# Patient Record
Sex: Female | Born: 1965 | Race: Black or African American | Hispanic: No | Marital: Single
Health system: Midwestern US, Community
[De-identification: ages and names within clinical notes are randomized; demographics above are authoritative.]

---

## 2013-11-30 IMAGING — CR DG HIP COMPLETE 2+V*L*
1 series · 2 of 2 positions shown · non-contrast
Comparison: None.

CLINICAL DATA: Pain, injury 3 days ago

EXAM:
LEFT HIP - COMPLETE 2+ VIEW

[Series 1: t hip ap left · 0.14mm/px · 2 of 2 slices shown]
[im 1/2]
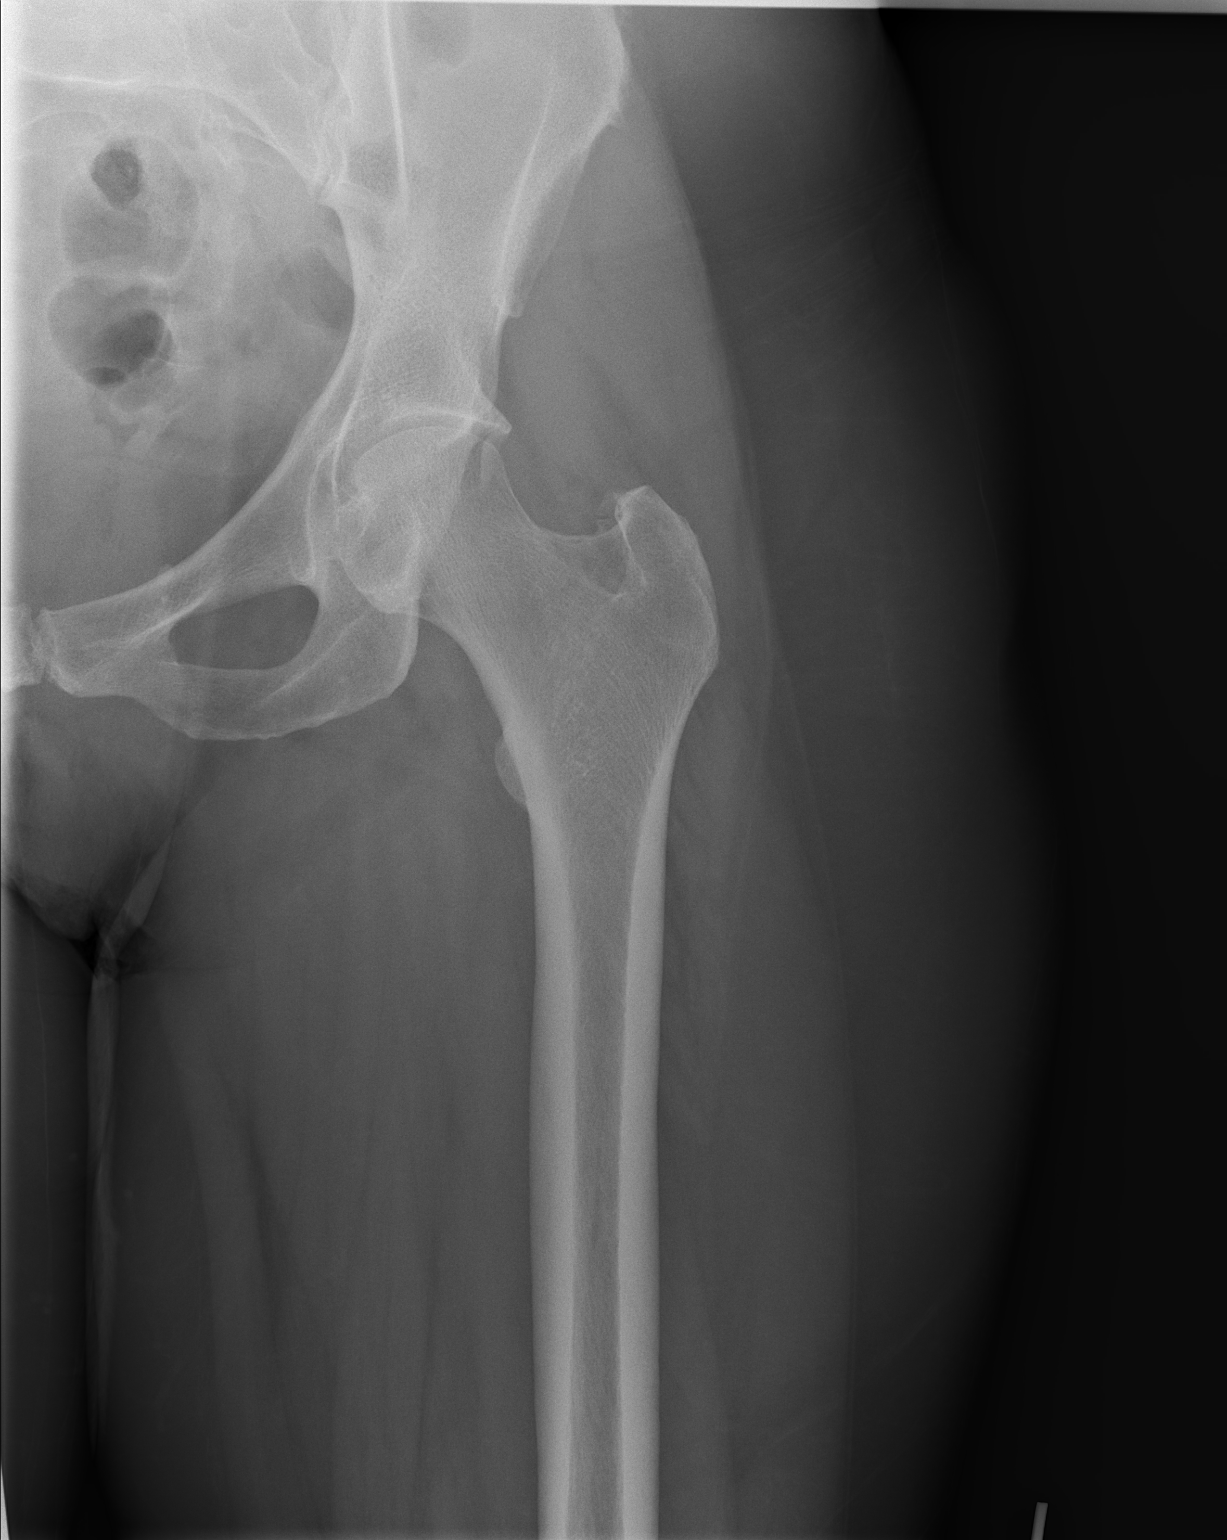
[im 2/2]
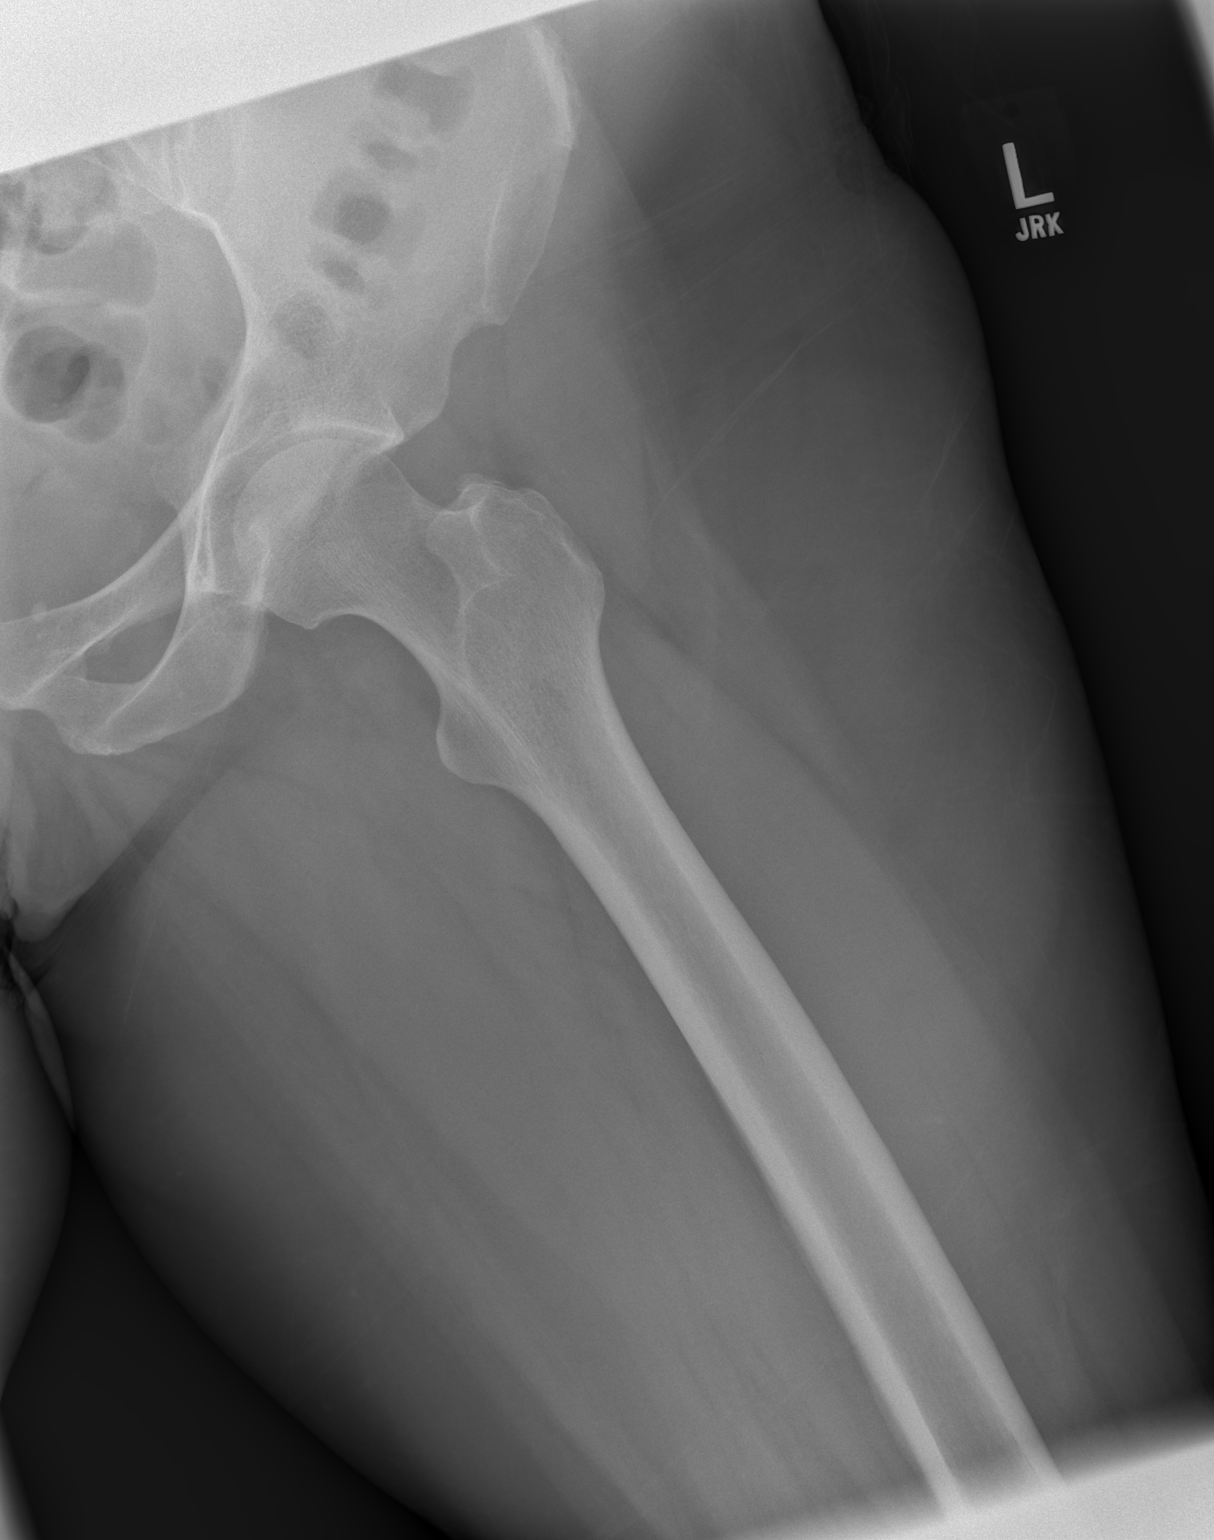

[2 of 2 positions shown; findings below may reference images not displayed]

FINDINGS: There is no evidence of hip fracture or dislocation. There is no
evidence of arthropathy or other focal bone abnormality.
IMPRESSION: Negative.

## 2014-01-16 ENCOUNTER — Ambulatory Visit: Admit: 2014-01-16 | Discharge: 2014-01-16 | Payer: PRIVATE HEALTH INSURANCE | Attending: Surgery

## 2014-01-16 DIAGNOSIS — I8390 Asymptomatic varicose veins of unspecified lower extremity: Secondary | ICD-10-CM

## 2014-01-16 NOTE — Progress Notes (Signed)
Subjective:      Patient ID: Pamela Shah is a 48 y.o. female.    HPI  This is a 48 yo F that I am seeing for venous insufficiency in both legs.  She has had issues with spider/varicose veins in both legs for most of her adult life.  She also complains of issues with achiness and fatigue, especially at night.  Symptoms occur in both legs, but are worse in the right leg.  She denies any issues with swelling.  She denies any personal hx of DVT.  She does have a strong family history of varicose veins.  She has worn thigh-high compression stockings in the past, but did not appreciate much benefit from them.  She also states she had what sounds like scler[place] [address] [place].  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    Review of Systems   Constitutional: Negative for fever, activity change and fatigue.   HENT: Negative for hearing loss, nosebleeds and trouble swallowing.    Eyes: Negative for visual disturbance.   Respiratory: Negative for cough, chest tightness, shortness of breath, wheezing and stridor.    Cardiovascular: Negative for chest pain, palpitations and leg swelling.   Gastrointestinal: Negative for nausea, vomiting and abdominal pain.   Endocrine: Negative for cold intolerance and heat intolerance.   Genitourinary: Positive for difficulty urinating.   Musculoskeletal: Positive for myalgias. Negative for back pain, joint swelling, arthralgias and gait problem.   Skin: Negative for color change, rash and wound.   Allergic/Immunologic: Positive for environmental allergies.   Neurological: Negative for dizziness, seizures, syncope, speech difficulty, numbness and headaches.   Hematological: Does not bruise/bleed easily.       Objective:   Physical Exam   Constitutional: She is oriented to person, place, and time. She appears well-developed and well-nourished.   HENT:   Head: Normocephalic and atraumatic.   Eyes: Conjunctivae and EOM are normal. Pupils are  equal, round, and reactive to light.   Neck: Normal range of motion. No JVD present.   Cardiovascular: Normal rate, regular rhythm and intact distal pulses.    Pulmonary/Chest: Effort normal and breath sounds normal.   Abdominal: Soft. She exhibits no distension. There is no tenderness.   Musculoskeletal: Normal range of motion. She exhibits no edema or tenderness.   BLE with prominent spider veins of both lower legs; small cluster of varicosities of left lateral calf; no ulcers   Lymphadenopathy:     She has no cervical adenopathy.   Neurological: She is alert and oriented to person, place, and time. No cranial nerve deficit.   Skin: Skin is warm and dry. No rash noted. No erythema.   Psychiatric: She has a normal mood and affect. Her behavior is normal. Judgment and thought content normal.       Assessment:      1. Varicose veins             Plan:      1. Varicose veins   -Had a long discussion about the causes of spider veins  -Will prescribe her knee-high 20 to 30 mmHg compression stockings---instructed her in their use  -Will also obtain BLE venous reflux study  -I did explain to her that wearing compression stockings should significantly help with her symptomatology, but would not decrease the appearance of her spider veins  -I also explained that I do not perform sclerotherapy

## 2014-01-23 ENCOUNTER — Encounter: Attending: Surgery

## 2014-02-16 ENCOUNTER — Ambulatory Visit: Admit: 2014-02-16 | Discharge: 2014-02-16 | Payer: PRIVATE HEALTH INSURANCE | Attending: Family Medicine

## 2014-02-16 DIAGNOSIS — J019 Acute sinusitis, unspecified: Secondary | ICD-10-CM

## 2014-02-16 LAB — POCT RAPID STREP A: Strep A Ag: NOT DETECTED

## 2014-02-16 MED ORDER — AZITHROMYCIN 250 MG PO TABS
250 MG | PACK | ORAL | Status: DC
Start: 2014-02-16 — End: 2014-02-28

## 2014-02-16 NOTE — Progress Notes (Signed)
Subjective:      Patient ID: Pamela Shah is a 48 y.o. female.    HPI  Here for eval sxs over the past week  Started 2 days ago, getting worse  + ST  + h/a  + minimal sinus drainage  + sneezing - yellow mucus  + mild PND    No cough  No dyspnea  No wheeze  No chest congestion  No ear sxs    No rash  + slight fever  No chills  Able to do usual activites - missed work? N  Nl appetite    Sick contacts: coworkers  Mgmt: ibuprofen, OTC cold and flu  no h/o allergies  no h/o asthma  no h/o sinus dz/infection  Non-smoker  S/p tonsillectomy (age 48)      Review of Systems   Constitutional: Positive for fever and fatigue. Negative for chills and activity change.   HENT: Positive for congestion, postnasal drip, rhinorrhea and sore throat. Negative for ear pain, hearing loss, nosebleeds, sinus pressure, trouble swallowing and voice change.    Eyes: Negative for discharge, redness and itching.   Respiratory: Negative for cough, chest tightness, shortness of breath and wheezing.    Cardiovascular: Negative for chest pain.   Gastrointestinal: Negative for nausea.   Skin: Negative for rash.   Allergic/Immunologic: Negative for environmental allergies.   Neurological: Positive for headaches. Negative for dizziness and light-headedness.       Objective:   Physical Exam   Constitutional: She is oriented to person, place, and time. Vital signs are normal. She appears well-developed and well-nourished. She is active.  Non-toxic appearance. She appears ill. No distress.   HENT:   Head: Normocephalic and atraumatic.   Right Ear: Tympanic membrane, external ear and ear canal normal.   Left Ear: Tympanic membrane, external ear and ear canal normal.   Nose: Mucosal edema and rhinorrhea present. Right sinus exhibits no maxillary sinus tenderness and no frontal sinus tenderness. Left sinus exhibits no maxillary sinus tenderness and no frontal sinus tenderness.   Mouth/Throat: Uvula is midline. Posterior oropharyngeal edema (mild) and  posterior oropharyngeal erythema present. No oropharyngeal exudate.   + PND   Eyes: Conjunctivae and lids are normal. No scleral icterus.   Neck: Neck supple. No thyroid mass and no thyromegaly present.   Cardiovascular: Normal rate, regular rhythm and normal heart sounds.    Pulmonary/Chest: Effort normal and breath sounds normal. She has no decreased breath sounds. She has no wheezes. She has no rhonchi. She has no rales.   Lymphadenopathy:     She has no cervical adenopathy.   Neurological: She is alert and oriented to person, place, and time.   Skin: Skin is warm, dry and intact. No rash noted.   Psychiatric: She has a normal mood and affect. Her behavior is normal.   Nursing note and vitals reviewed.      RST: neg        Assessment:      1. Acute sinusitis, recurrence not specified, unspecified location  azithromycin (ZITHROMAX Z-PAK) 250 MG tablet   2. Sore throat  POCT rapid strep A            Plan:      Nasal saline sprays (ie: Ocean, [place])  Tylenol and/or ibuprofen prn pain  Mucinex  Decongestants (ie: sudafed, -D) prn pressure, pain  Antibiotics as prescribed           Fredna Stricker T Kaidon Kinker  Follow up with PCP in 3-5 days. Call for appointment with your current PCP or please set up to establish with new PCP ASAP for appropriate follow up.

## 2014-02-28 ENCOUNTER — Ambulatory Visit: Admit: 2014-02-28 | Discharge: 2014-02-28 | Payer: PRIVATE HEALTH INSURANCE | Attending: Family

## 2014-02-28 DIAGNOSIS — J069 Acute upper respiratory infection, unspecified: Secondary | ICD-10-CM

## 2014-02-28 MED ORDER — PREDNISONE 50 MG PO TABS
50 MG | ORAL_TABLET | ORAL | Status: AC
Start: 2014-02-28 — End: ?

## 2014-02-28 MED ORDER — BENZONATATE 100 MG PO CAPS
100 MG | ORAL_CAPSULE | ORAL | Status: AC
Start: 2014-02-28 — End: ?

## 2014-02-28 MED ORDER — ALBUTEROL SULFATE (2.5 MG/3ML) 0.083% IN NEBU
Freq: Once | RESPIRATORY_TRACT | Status: AC
Start: 2014-02-28 — End: 2014-02-28
  Administered 2014-02-28: 16:00:00 2.5 mg via RESPIRATORY_TRACT

## 2014-02-28 MED ORDER — LEVOFLOXACIN 500 MG PO TABS
500 MG | ORAL_TABLET | Freq: Every day | ORAL | Status: AC
Start: 2014-02-28 — End: 2014-03-05

## 2014-02-28 NOTE — Patient Instructions (Signed)
Complete antibiotic and steroid.  USe albuterol every 4-6 hours as needed.  If no improvement or worsening in 48 hours, see PCP for follow-up.  Increase fluids.  Mucinex as needed for chest congestion.

## 2014-02-28 NOTE — Progress Notes (Signed)
Subjective:     Patient: Pamela Shah is a 48 y.o. female         URI   This is a new problem. The current episode started in the past 7 days (Wednesday night). The problem has been gradually worsening. The maximum temperature recorded prior to her arrival was 101 - 101.9 F. The fever has been present for less than 1 day. Associated symptoms include congestion, coughing, ear pain, headaches, rhinorrhea, sinus pain, a sore throat and wheezing. Pertinent negatives include no chest pain. She has tried acetaminophen and inhaler use for the symptoms. The treatment provided mild relief.      Pt was here a couple weeks ago for similar symptoms (treated with z-pak).  Reports she got better, then symptoms returned two days ago.  Has been using albuterol 4 times daily for current cough.  Has taken prednisone in the past for similar symptoms (last had about 6 months ago).  Has had tessalon perles in the past with good relief of symptoms.  No recent sick contacts.    Review of Systems   Constitutional: Positive for fever.   HENT: Positive for congestion, ear pain, rhinorrhea and sore throat.    Respiratory: Positive for cough and wheezing.    Cardiovascular: Negative for chest pain.   Musculoskeletal: Negative for myalgias.   Allergic/Immunologic: Negative for immunocompromised state.   Neurological: Positive for headaches.        Allergies   Allergen Reactions   ??? Pcn [Penicillins] Itching   ??? Sulfa Antibiotics Itching     Current Outpatient Prescriptions on File Prior to Visit   Medication Sig Dispense Refill   ??? Drospiren-Eth Estrad-Levomefol (BEYAZ PO) Take by mouth     ??? Loratadine (CLARITIN) 10 MG CAPS Take by mouth     ??? montelukast (SINGULAIR) 10 MG tablet Take 10 mg by mouth nightly     ??? albuterol (PROVENTIL HFA;VENTOLIN HFA) 108 (90 BASE) MCG/ACT inhaler Inhale 2 puffs into the lungs every 6 hours as needed for Wheezing     ??? Budesonide-Formoterol Fumarate (SYMBICORT IN) Inhale into the lungs       No current  facility-administered medications on file prior to visit.      Past Medical History   Diagnosis Date   ??? Asthma    ??? Allergic rhinitis    ??? Varicose veins       History   Substance Use Topics   ??? Smoking status: Never Smoker    ??? Smokeless tobacco: Never Used   ??? Alcohol Use: 0.0 oz/week     0 Not specified per week          Objective:     BP 103/72 mmHg   Pulse 99   Temp(Src) 99.5 ??F (37.5 ??C) (Temporal)   Ht 5\' 5"  (1.651 m)   Wt 180 lb (81.647 kg)   BMI 29.95 kg/m2   SpO2 100%    Physical Exam   Constitutional: She appears ill.   HENT:   Right Ear: Tympanic membrane normal.   Left Ear: Tympanic membrane is retracted. Tympanic membrane is not erythematous and not bulging. A middle ear effusion is present.   Nose: Mucosal edema and rhinorrhea present. Right sinus exhibits no maxillary sinus tenderness and no frontal sinus tenderness. Left sinus exhibits no maxillary sinus tenderness and no frontal sinus tenderness.   Mouth/Throat: Uvula is midline and mucous membranes are normal. Posterior oropharyngeal erythema present. No posterior oropharyngeal edema.   Cardiovascular: Normal rate,  regular rhythm and normal heart sounds.    Pulmonary/Chest: Effort normal. She has decreased breath sounds in the right lower field and the left lower field. She has wheezes in the right upper field and the left upper field. She has rhonchi in the right upper field, the right lower field, the left upper field and the left lower field. She has no rales.   After breathing treatment, rhonchi greatly diminished (none further noted in lower lobes, scant expiratory in upper lobes).  No further wheezes. Air movement improved in lower lobes.   Lymphadenopathy:     She has no cervical adenopathy.   Neurological: She is alert.       Assessment      1. URI, acute    2. Asthma exacerbation         Plan      1. URI, acute  - levofloxacin (LEVAQUIN) 500 MG tablet; Take 1 tablet by mouth daily for 5 days  Dispense: 5 tablet; Refill: 0    2. Asthma  exacerbation  - albuterol (PROVENTIL) nebulizer solution 2.5 mg; Take 3 mLs by nebulization once  - predniSONE (DELTASONE) 50 MG tablet; One tablet PO QD x 5 days  Dispense: 5 tablet; Refill: 0  - benzonatate (TESSALON PERLES) 100 MG capsule; One tablet PO TID PRN for cough  Dispense: 15 capsule; Refill: 0  Complete antibiotic and steroid.  USe albuterol every 4-6 hours as needed.  If no improvement or worsening in 48 hours, see PCP for follow-up.  Increase fluids.  Mucinex as needed for chest congestion.  Nolon BussingMegan Hummel  02/28/14  11:48 AM

## 2014-04-23 NOTE — Telephone Encounter (Signed)
LMOVM QQ:VZDGre:date and time [facility] at 12:30pm,also follow up appt with Dr Sharlett IlesSudimak March 3rd at 9:30am.

## 2014-05-14 NOTE — Telephone Encounter (Signed)
LMOVM for pt GN:FAOZre:appt 05/15/14,appt cancelled due to testing prior to appt not done,pt was a no show.  Left phone number for Central Scheduling for pt to call and reschedule testing [phone] then call office to reschedule follow up appt with Dr Sharlett IlesSudimak.

## 2014-05-15 ENCOUNTER — Encounter: Attending: Surgery

## 2016-10-30 ENCOUNTER — Inpatient Hospital Stay: Admit: 2016-10-30 | Discharge: 2016-10-30 | Disposition: A | Discharge status: Home / Self Care

## 2016-10-30 DIAGNOSIS — W461XXA Contact with contaminated hypodermic needle, initial encounter: Secondary | ICD-10-CM

## 2016-10-30 LAB — HEPATITIS B SURFACE ANTIBODY: Hep B S Ab: 441.5 m[IU]/mL

## 2016-10-30 LAB — HIV SCREEN: HIV 1+2 AB+HIV1P24 AG, EIA: NONREACTIVE NA

## 2016-10-30 LAB — HEPATITIS C ANTIBODY: Hepatitis C Ab: NOT DETECTED NA

## 2016-10-30 LAB — HCG, SERUM, QUALITATIVE: hCG Qual: NEGATIVE m[IU]/mL

## 2016-10-30 MED ORDER — BACITRACIN 500 UNIT/GM EX OINT
500 UNIT/GM | Freq: Once | CUTANEOUS | Status: DC
Start: 2016-10-30 — End: 2016-10-30

## 2016-10-30 NOTE — ED Notes (Signed)
Patient denies further questions after reviewing discharge paperwork. Patient to follow up with pcp. Patient ambulated out of ED with steady gait       Geneen Dieter A. Luanna Salk, RN  10/30/16 [phone]

## 2016-10-30 NOTE — Other (Unsigned)
Patient Acct Nbr: [id number]   Primary AUTH/CERT:   Primary Insurance Company Name: Workers Programmer, applications Plan name: [employer] Self Insured Associate Professor Group Number:   Editor, commissioning Plan Type: Health  Primary Insurance Policy Number: [id number]    Secondary AUTH/CERT:   Secondary Insurance Company Name: [employer] Plan name: [facility] HMO [facility]  Secondary Insurance Group Number: [id number]  Secondary Insurance Plan Type: Health  Secondary Insurance Policy Number: [id number]

## 2016-10-30 NOTE — ED Provider Notes (Signed)
[facility] EMERGENCY DEPT  eMERGENCY dEPARTMENT eNCOUnter      Pt Name: Pamela Shah  MRN: [id number]  Birthdate 12-16-1965  Date of evaluation: 10/30/2016  Provider: Rollene Fare., APRN - CNP     This patient was seen within my scope of practice with Dr. Dorcas Mcmurray available in the department for consultation    CHIEF COMPLAINT       Chief Complaint   Patient presents with   . Body Fluid Exposure     works at [employer], stuck her self c insulin needle p injection.          HISTORY OF PRESENT ILLNESS   (Location/Symptom, Timing/Onset, Context/Setting, Quality, Duration, Modifying Factors, Severity) Note limiting factors.   HPI    Pamela Shah is a 51 y.o. female who presents to the emergency department via private vehicle after a needlestick that she obtained at work just prior to arrival. She states that she administered insulin and tried to recap the needle and grabbed the cap after it was recapped. The needle was sticking through the cap and poked her in the finger. She states that she immediately washed the area and milked the finger. She states that she made sure that she cleaned it really well after the incident. She denies pain. She reports that the patient who had received the insulin was positive for Hepatitis C, but she is not aware of other blood bourne illnesses for that patient. She denies chest pain, shortness of breath, nausea, vomiting, fever, chills, abdominal pain, change in bowel or bladder habits. Her past medical history includes asthma. Her past surgical history includes a tonsillectomy and adenoidectomy. She states she is a never smoker, social drinker, and denies illicit drug use.      Nursing Notes were reviewed.    REVIEW OF SYSTEMS    (2+ for level 4; 10+ for level 5)   Review of Systems   Constitutional: Negative for chills, fatigue and fever.   HENT: Negative for congestion, ear pain, rhinorrhea, sneezing and sore throat.    Eyes: Negative for visual disturbance.   Respiratory: Negative for cough and  shortness of breath.    Cardiovascular: Negative for chest pain and leg swelling.   Gastrointestinal: Negative for abdominal pain, nausea and vomiting.   Endocrine: Negative for cold intolerance and heat intolerance.   Genitourinary: Negative for difficulty urinating, dysuria and urgency.   Musculoskeletal: Negative for myalgias and neck pain.   Skin: Positive for wound. Negative for pallor and rash.        Needlestick to LEFT index finger   Allergic/Immunologic: Negative for environmental allergies.   Neurological: Negative for dizziness, light-headedness and headaches.   Hematological: Does not bruise/bleed easily.   Psychiatric/Behavioral: Negative for confusion and sleep disturbance. The patient is nervous/anxious.        PAST MEDICAL HISTORY     Past Medical History:   Diagnosis Date   . Allergic rhinitis    . Asthma    . Varicose veins        SURGICAL HISTORY       Past Surgical History:   Procedure Laterality Date   . NASAL SEPTUM SURGERY     . TONSILLECTOMY AND ADENOIDECTOMY     . TUBAL LIGATION         CURRENT MEDICATIONS       Discharge Medication List as of 10/30/2016  4:27 PM      CONTINUE these medications which have NOT CHANGED    Details  Acetaminophen (TYLENOL 8 HOUR PO) Take by mouthHistorical Med      predniSONE (DELTASONE) 50 MG tablet One tablet PO QD x 5 days, Disp-5 tablet, R-0Normal      benzonatate (TESSALON PERLES) 100 MG capsule One tablet PO TID PRN for cough, Disp-15 capsule, R-0Normal      Drospiren-Eth Estrad-Levomefol (BEYAZ PO) Take by mouthHistorical Med      Loratadine (CLARITIN) 10 MG CAPS Take by mouthHistorical Med      montelukast (SINGULAIR) 10 MG tablet Take 10 mg by mouth nightlyHistorical Med      albuterol (PROVENTIL HFA;VENTOLIN HFA) 108 (90 BASE) MCG/ACT inhaler Inhale 2 puffs into the lungs every 6 hours as needed for WheezingHistorical Med      Budesonide-Formoterol Fumarate (SYMBICORT IN) Inhale into the lungsHistorical Med             ALLERGIES     Pcn [penicillins]  and Sulfa antibiotics    FAMILY HISTORY       Family History   Problem Relation Age of Onset   . Other Mother         Varicose veins        SOCIAL HISTORY       Social History     Social History   . Marital status: Single     Spouse name: N/A   . Number of children: N/A   . Years of education: N/A     Social History Main Topics   . Smoking status: Never Smoker   . Smokeless tobacco: Never Used   . Alcohol use 0.0 oz/week   . Drug use: No   . Sexual activity: Not on file     Other Topics Concern   . Not on file     Social History Narrative   . No narrative on file       SCREENINGS           PHYSICAL EXAM    (up to 7 for level 4, 8 or more for level 5)     ED Triage Vitals [10/30/16 1321]   BP Temp Temp src Pulse Resp SpO2 Height Weight   (!) 133/97 97.9 F (36.6 C) -- 84 20 100 % 5\' 6"  (1.676 m) 185 lb (83.9 kg)       Physical Exam   Constitutional: She is oriented to person, place, and time. She appears well-developed and well-nourished. No distress.   HENT:   Head: Normocephalic and atraumatic.   Neck: Normal range of motion. Neck supple.   Cardiovascular: Normal rate, regular rhythm, normal heart sounds and intact distal pulses.  Exam reveals no gallop and no friction rub.    No murmur heard.  Pulmonary/Chest: Effort normal and breath sounds normal. No respiratory distress. She has no wheezes. She has no rales. She exhibits no tenderness.   Abdominal: Soft. Bowel sounds are normal. There is no tenderness.   Musculoskeletal: Normal range of motion. She exhibits no edema, tenderness or deformity.   She has full flexion and extension of the LEFT index finger. Two point discrimination intact. There is a small mark, consistent with needlestick to the DIP. Capillary refill is less than 3 seconds. Radial pulse is intact.   Neurological: She is alert and oriented to person, place, and time.   Skin: Skin is warm and dry. No rash noted. She is not diaphoretic.   Small mark, consistent with a needlestick to the LEFT index  finger. There is no surrounding erythema or edema. No  ecchymosis present. There is no drainage noted from the site of the needlestick   Psychiatric: She has a normal mood and affect. Her behavior is normal.       DIAGNOSTIC RESULTS     EKG (Per Emergency Physician):       RADIOLOGY (Per Emergency Physician):       Interpretation per the Radiologist below, if available at the time of this note:  No results found.    ED BEDSIDE ULTRASOUND:   Performed by ED Physician - none    LABS:  Labs Reviewed   HEPATITIS C ANTIBODY    Narrative:     Test Performed by [facility], [address], [place], [place]  [address]   HEPATITIS B SURFACE ANTIBODY    Narrative:     Test Performed by [facility], [address] [place], [place]  [address]   HCG, SERUM, QUALITATIVE    Narrative:     Test Performed by [facility], [address] [place], [place]  [address]   HIV-1 AND HIV-2 ANTIBODIES    Narrative:     Test Performed by [facility], [address] [place], [place]  [address]        All other labs were within normal range or not returned as of this dictation.    EMERGENCY DEPARTMENT COURSE and DIFFERENTIAL DIAGNOSIS/MDM:   Vitals:    Vitals:    10/30/16 1321   BP: (!) 133/97   Pulse: 84   Resp: 20   Temp: 97.9 F (36.6 C)   SpO2: 100%   Weight: 83.9 kg (185 lb)   Height: 5\' 6"  (1.676 m)       Medications - No data to display    MDM.  Imaging was considered, but not ordered, as there is low suspicion of foreign body or fracture. She states that her tetanus is current, so booster will be held today. She was offered Tylenol in the emergency department and declined. Needlestick protocol was initiated by nursing staff. She stated that she wanted to wait for all of her results, and was requesting prophylactic Hepatitis C treatment. I spoke with her in depth regarding this, as there is no available prophylactic treatment for Hepatitis B. She verbalized understanding.   The wound was cleansed and treated by nursing staff. DSD was  applied.  Hepatitis C resulted as not detected. She does have surface antibody to Hepatitis B. Serum HCG was negative. Her HIV was non reactive.  She does not appear toxic or septic. She remained stable throughout her course in the emergency department. She was instructed to follow up with corporate health, and was given the appropriate follow up contact information. She was instructed to follow up with her primary care physician. She was instructed to return to the emergency department for any new or worsening symptoms. She was educated on the process of rechecking lab values to check for seroconversion.  All of her questions were answered to the best of my ability. She is to return to work without restriction.    REVAL:         CRITICAL CARE TIME   Total Critical Care time was 0 minutes, excluding separately reportable procedures.  There was a high probability of clinically significant/life threatening deterioration in the patient's condition which required my urgent intervention.     CONSULTS:  None    PROCEDURES:  Unless otherwise noted below, none     Procedures    FINAL  IMPRESSION      1. Needlestick injury accident with exposure to body fluid    2. Finger pain, left          DISPOSITION/PLAN   DISPOSITION Decision To Discharge 10/30/2016 04:18:52 PM      PATIENT REFERRED TO:  [facility] - Main Office  [address]  [place]  [place] [address]  [phone]  Call in 1 day  Call first thing in the morning for an appointment      DISCHARGE MEDICATIONS:  Discharge Medication List as of 10/30/2016  4:27 PM             (Please note:  Portions of this note were completed with a voice recognition program.  Efforts were made to edit the dictations but occasionally words and phrases are mis-transcribed.)  Form [id number].[id number]    Rollene Fare., APRN - CNP (electronically signed)  Emergency Medicine Provider        Gerome Apley. Greig Castilla, APRN - CNP  11/20/16 1815

## 2016-10-30 NOTE — ED Notes (Signed)
Bed: 05  Expected date:   Expected time:   Means of arrival:   Comments:  triage     Jossie Ng, RN  10/30/16 1315

## 2016-11-20 NOTE — Discharge Instructions (Addendum)
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  It is your employer's responsibility to determine if restrictions can be accommodated.    Today's Date: 10/30/16       Patient Name: Pamela Shah, Pamela Shah  DOB: Jan 26, 1966     Employee may return to work no restrictions on (Date): 10/31/2016                     ED Provider Signature:   _______________________________                         No att. providers found    Work injuries require treatment [facility] a [facility] certified provider. If follow-up care is needed please call one of the [facility] locations below.    [facility]: [phone] [address], [place], [place] [id number]  [place]: [phone]  [address], [place], [place] [id number]  [facility]: [phone] [address] [place] [address] [place], [place] [id number]  [facility]: [phone]  [address], [address], [place], [place] [id number]
